# Patient Record
Sex: Male | Born: 1950 | Hispanic: Yes | Marital: Married | Smoking: Never smoker
Health system: Southern US, Community
[De-identification: ages and names within clinical notes are randomized; demographics above are authoritative.]

## PROBLEM LIST (undated history)

## (undated) DIAGNOSIS — I10 Essential (primary) hypertension: Secondary | ICD-10-CM

## (undated) DIAGNOSIS — E119 Type 2 diabetes mellitus without complications: Secondary | ICD-10-CM

## (undated) HISTORY — DX: Type 2 diabetes mellitus without complications: E11.9

## (undated) HISTORY — DX: Essential (primary) hypertension: I10

---

## 2016-07-23 ENCOUNTER — Ambulatory Visit: Payer: Self-pay

## 2016-07-25 ENCOUNTER — Encounter: Payer: Self-pay | Admitting: Emergency Medicine

## 2016-07-25 ENCOUNTER — Ambulatory Visit (INDEPENDENT_AMBULATORY_CARE_PROVIDER_SITE_OTHER): Payer: Self-pay | Admitting: Emergency Medicine

## 2016-07-25 VITALS — BP 136/78 | HR 78 | Temp 98.1°F | Resp 17 | Ht 70.5 in | Wt 210.0 lb

## 2016-07-25 DIAGNOSIS — E119 Type 2 diabetes mellitus without complications: Secondary | ICD-10-CM

## 2016-07-25 DIAGNOSIS — E079 Disorder of thyroid, unspecified: Secondary | ICD-10-CM

## 2016-07-25 DIAGNOSIS — I10 Essential (primary) hypertension: Secondary | ICD-10-CM

## 2016-07-25 MED ORDER — METFORMIN HCL ER 750 MG PO TB24: 750.0000 mg | ORAL_TABLET | Freq: Every day | ORAL | 5 refills | Status: AC

## 2016-07-25 MED ORDER — LEVOTHYROXINE SODIUM 50 MCG PO TABS: 50.0000 ug | ORAL_TABLET | Freq: Every day | ORAL | 3 refills | Status: AC

## 2016-07-25 MED ORDER — CARVEDILOL 6.25 MG PO TABS: 6.2500 mg | ORAL_TABLET | Freq: Two times a day (BID) | ORAL | 3 refills | Status: DC

## 2016-07-25 MED ORDER — IRBESARTAN 150 MG PO TABS: 150.0000 mg | ORAL_TABLET | Freq: Every day | ORAL | 5 refills | Status: AC

## 2016-07-25 MED ORDER — NIFEDIPINE ER 30 MG PO TB24: 30.0000 mg | ORAL_TABLET | Freq: Every day | ORAL | 5 refills | Status: AC

## 2016-07-25 NOTE — Patient Instructions (Addendum)
   IF you received an x-ray today, you will receive an invoice from [facility]. Please contact [facility] at [phone] with questions or concerns regarding your invoice.   IF you received labwork today, you will receive an invoice from [facility]. Please contact [facility] at [phone] with questions or concerns regarding your invoice.   Our billing staff will not be able to assist you with questions regarding bills from these companies.  You will be contacted with the lab results as soon as they are available. The fastest way to get your results is to activate your My Chart account. Instructions are located on the last page of this paperwork. If you have not heard from us regarding the results in 2 weeks, please contact this office.      Hypertension Hypertension is another name for high blood pressure. High blood pressure forces your heart to work harder to pump blood. This can cause problems over time. There are two numbers in a blood pressure reading. There is a top number (systolic) over a bottom number (diastolic). It is best to have a blood pressure below 120/80. Healthy choices can help lower your blood pressure. You may need medicine to help lower your blood pressure if:  Your blood pressure cannot be lowered with healthy choices.  Your blood pressure is higher than 130/80. Follow these instructions at home: Eating and drinking   If directed, follow the DASH eating plan. This diet includes:  Filling half of your plate at each meal with fruits and vegetables.  Filling one quarter of your plate at each meal with whole grains. Whole grains include whole wheat pasta, brown rice, and whole grain bread.  Eating or drinking low-fat dairy products, such as skim milk or low-fat yogurt.  Filling one quarter of your plate at each meal with low-fat (lean) proteins. Low-fat proteins include fish, skinless chicken, eggs, beans, and tofu.  Avoiding fatty meat,  cured and processed meat, or chicken with skin.  Avoiding premade or processed food.  Eat less than 1,500 mg of salt (sodium) a day.  Limit alcohol use to no more than 1 drink a day for nonpregnant women and 2 drinks a day for men. One drink equals 12 oz of beer, 5 oz of wine, or 1 oz of hard liquor. Lifestyle   Work with your doctor to stay at a healthy weight or to lose weight. Ask your doctor what the best weight is for you.  Get at least 30 minutes of exercise that causes your heart to beat faster (aerobic exercise) most days of the week. This may include walking, swimming, or biking.  Get at least 30 minutes of exercise that strengthens your muscles (resistance exercise) at least 3 days a week. This may include lifting weights or pilates.  Do not use any products that contain nicotine or tobacco. This includes cigarettes and e-cigarettes. If you need help quitting, ask your doctor.  Check your blood pressure at home as told by your doctor.  Keep all follow-up visits as told by your doctor. This is important. Medicines   Take over-the-counter and prescription medicines only as told by your doctor. Follow directions carefully.  Do not skip doses of blood pressure medicine. The medicine does not work as well if you skip doses. Skipping doses also puts you at risk for problems.  Ask your doctor about side effects or reactions to medicines that you should watch for. Contact a doctor if:  You think you are having a   reaction to the medicine you are taking.  You have headaches that keep coming back (recurring).  You feel dizzy.  You have swelling in your ankles.  You have trouble with your vision. Get help right away if:  You get a very bad headache.  You start to feel confused.  You feel weak or numb.  You feel faint.  You get very bad pain in your:  Chest.  Belly (abdomen).  You throw up (vomit) more than once.  You have trouble  breathing. Summary  Hypertension is another name for high blood pressure.  Making healthy choices can help lower blood pressure. If your blood pressure cannot be controlled with healthy choices, you may need to take medicine. This information is not intended to replace advice given to you by your health care provider. Make sure you discuss any questions you have with your health care provider. Document Released: 09/28/2007 Document Revised: 03/09/2016 Document Reviewed: 03/09/2016 Elsevier Interactive Patient Education  2017 Elsevier Inc.  

## 2016-07-25 NOTE — Progress Notes (Signed)
John Rogers 66 y.o.   Chief Complaint  Patient presents with  . Medication Refill    metformin, nifedipina,levothryin carvadol , irbesartan     HISTORY OF PRESENT ILLNESS: This is a 66 y.o. male with h/o HTN and DMT2 needs meds refill. Asymptomatic and has no complaints.  HPI   Prior to Admission medications   Medication Sig Start Date End Date Taking? Authorizing Provider  carvedilol (COREG) 6.25 MG tablet Take 1 tablet (6.25 mg total) by mouth 2 (two) times daily with a meal. 07/25/16   Georgina Quint, MD  irbesartan (AVAPRO) 150 MG tablet Take 1 tablet (150 mg total) by mouth daily. 07/25/16 08/24/16  Georgina Quint, MD  levothyroxine (SYNTHROID, LEVOTHROID) 50 MCG tablet Take 1 tablet (50 mcg total) by mouth daily. 07/25/16   Georgina Quint, MD  metFORMIN (GLUCOPHAGE XR) 750 MG 24 hr tablet Take 1 tablet (750 mg total) by mouth daily with breakfast. 07/25/16 08/24/16  Georgina Quint, MD  NIFEdipine (PROCARDIA-XL/ADALAT CC) 30 MG 24 hr tablet Take 1 tablet (30 mg total) by mouth daily. 07/25/16 08/24/16  Georgina Quint, MD    Allergies not on file  There are no active problems to display for this patient.   No past medical history on file.  No past surgical history on file.  Social History   Social History  . Marital status: Married    Spouse name: N/A  . Number of children: N/A  . Years of education: N/A   Occupational History  . Not on file.   Social History Main Topics  . Smoking status: Not on file  . Smokeless tobacco: Not on file  . Alcohol use Not on file  . Drug use: Unknown  . Sexual activity: Not on file   Other Topics Concern  . Not on file   Social History Narrative  . No narrative on file    No family history on file.   Review of Systems  Constitutional: Negative for chills, fever, malaise/fatigue and weight loss.  HENT: Negative for nosebleeds, sinus pain and sore throat.   Eyes: Negative for blurred vision, double  vision, discharge and redness.  Respiratory: Negative for cough, shortness of breath and wheezing.   Cardiovascular: Negative for chest pain, palpitations, claudication and leg swelling.  Gastrointestinal: Negative for abdominal pain, diarrhea, nausea and vomiting.  Genitourinary: Negative for dysuria and hematuria.  Musculoskeletal: Negative for myalgias and neck pain.  Skin: Negative for rash.  Neurological: Negative for dizziness and headaches.  All other systems reviewed and are negative.  Vitals:   07/25/16 1410  BP: 136/78  Pulse: 78  Resp: 17  Temp: 98.1 F (36.7 C)     Physical Exam  Constitutional: He is oriented to person, place, and time. He appears well-developed and well-nourished.  HENT:  Head: Normocephalic and atraumatic.  Nose: Nose normal.  Mouth/Throat: Oropharynx is clear and moist. No oropharyngeal exudate.  Eyes: Conjunctivae and EOM are normal. Pupils are equal, round, and reactive to light.  Neck: Normal range of motion. Neck supple. No JVD present.  Cardiovascular: Normal rate, regular rhythm, normal heart sounds and intact distal pulses.   Pulmonary/Chest: Effort normal and breath sounds normal.  Abdominal: Soft. Bowel sounds are normal. He exhibits no distension. There is no tenderness.  Musculoskeletal: Normal range of motion.  Lymphadenopathy:    He has no cervical adenopathy.  Neurological: He is alert and oriented to person, place, and time. He displays normal reflexes. No  sensory deficit. He exhibits normal muscle tone.  Skin: Skin is warm and dry. Capillary refill takes less than 2 seconds.  Psychiatric: He has a normal mood and affect. His behavior is normal.  Vitals reviewed.    ASSESSMENT & PLAN: Zayven was seen today for medication refill.  Diagnoses and all orders for this visit:  Essential hypertension  Type 2 diabetes mellitus without complication, without long-term current use of insulin (HCC)  Thyroid condition  Other  orders -     NIFEdipine (PROCARDIA-XL/ADALAT CC) 30 MG 24 hr tablet; Take 1 tablet (30 mg total) by mouth daily. -     irbesartan (AVAPRO) 150 MG tablet; Take 1 tablet (150 mg total) by mouth daily. -     metFORMIN (GLUCOPHAGE XR) 750 MG 24 hr tablet; Take 1 tablet (750 mg total) by mouth daily with breakfast. -     carvedilol (COREG) 6.25 MG tablet; Take 1 tablet (6.25 mg total) by mouth 2 (two) times daily with a meal. -     levothyroxine (SYNTHROID, LEVOTHROID) 50 MCG tablet; Take 1 tablet (50 mcg total) by mouth daily.    Patient Instructions       IF you received an x-ray today, you will receive an invoice from [facility]. Please contact [facility] at [phone] with questions or concerns regarding your invoice.   IF you received labwork today, you will receive an invoice from [facility]. Please contact [facility] at [phone] with questions or concerns regarding your invoice.   Our billing staff will not be able to assist you with questions regarding bills from these companies.  You will be contacted with the lab results as soon as they are available. The fastest way to get your results is to activate your My Chart account. Instructions are located on the last page of this paperwork. If you have not heard from [place] regarding the results in 2 weeks, please contact this office.      Hypertension Hypertension is another name for high blood pressure. High blood pressure forces your heart to work harder to pump blood. This can cause problems over time. There are two numbers in a blood pressure reading. There is a top number (systolic) over a bottom number (diastolic). It is best to have a blood pressure below 120/80. Healthy choices can help lower your blood pressure. You may need medicine to help lower your blood pressure if:  Your blood pressure cannot be lowered with healthy choices.  Your blood pressure is higher than 130/80. Follow these instructions at  home: Eating and drinking   If directed, follow the DASH eating plan. This diet includes:  Filling half of your plate at each meal with fruits and vegetables.  Filling one quarter of your plate at each meal with whole grains. Whole grains include whole wheat pasta, brown rice, and whole grain bread.  Eating or drinking low-fat dairy products, such as skim milk or low-fat yogurt.  Filling one quarter of your plate at each meal with low-fat (lean) proteins. Low-fat proteins include fish, skinless chicken, eggs, beans, and tofu.  Avoiding fatty meat, cured and processed meat, or chicken with skin.  Avoiding premade or processed food.  Eat less than 1,500 mg of salt (sodium) a day.  Limit alcohol use to no more than 1 drink a day for nonpregnant women and 2 drinks a day for men. One drink equals 12 oz of beer, 5 oz of wine, or 1 oz of hard liquor. Lifestyle   Work with  your doctor to stay at a healthy weight or to lose weight. Ask your doctor what the best weight is for you.  Get at least 30 minutes of exercise that causes your heart to beat faster (aerobic exercise) most days of the week. This may include walking, swimming, or biking.  Get at least 30 minutes of exercise that strengthens your muscles (resistance exercise) at least 3 days a week. This may include lifting weights or pilates.  Do not use any products that contain nicotine or tobacco. This includes cigarettes and e-cigarettes. If you need help quitting, ask your doctor.  Check your blood pressure at home as told by your doctor.  Keep all follow-up visits as told by your doctor. This is important. Medicines   Take over-the-counter and prescription medicines only as told by your doctor. Follow directions carefully.  Do not skip doses of blood pressure medicine. The medicine does not work as well if you skip doses. Skipping doses also puts you at risk for problems.  Ask your doctor about side effects or reactions to  medicines that you should watch for. Contact a doctor if:  You think you are having a reaction to the medicine you are taking.  You have headaches that keep coming back (recurring).  You feel dizzy.  You have swelling in your ankles.  You have trouble with your vision. Get help right away if:  You get a very bad headache.  You start to feel confused.  You feel weak or numb.  You feel faint.  You get very bad pain in your:  Chest.  Belly (abdomen).  You throw up (vomit) more than once.  You have trouble breathing. Summary  Hypertension is another name for high blood pressure.  Making healthy choices can help lower blood pressure. If your blood pressure cannot be controlled with healthy choices, you may need to take medicine. This information is not intended to replace advice given to you by your health care provider. Make sure you discuss any questions you have with your health care provider. Document Released: 09/28/2007 Document Revised: 03/09/2016 Document Reviewed: 03/09/2016 Elsevier Interactive Patient Education  2017 Elsevier Inc.      Edwina Barth, MD [facility]

## 2016-09-09 ENCOUNTER — Telehealth: Payer: Self-pay | Admitting: Emergency Medicine

## 2016-09-09 NOTE — Telephone Encounter (Signed)
Patients daughter called in stating that Dr  Alvy BimlerSagardia gave her dad a RX for NIFEdipine (PROCARDIA-XL/ADALAT CC) 30 MG 24 hr tablet but it needs to be 60MG  per the patients Cardiologist.  [place] has since moved to [place] and the medication needs to be sent to [address] [place], [place] [id number] phone number for the store is [phone]   The call back number for the patients daughter is  [phone]

## 2016-09-09 NOTE — Telephone Encounter (Signed)
Advised we can not change dose, they would have to call cardiologist who changed for refill. 30mg  was sent to local pharmacy in [place] in April with 5 refills Daughter will call cardiology

## 2016-09-09 NOTE — Telephone Encounter (Signed)
CORRECTION ON PHARMACY NUMBER  [phone]

## 2016-10-08 ENCOUNTER — Other Ambulatory Visit: Payer: Self-pay | Admitting: Emergency Medicine

## 2017-08-10 ENCOUNTER — Telehealth: Payer: Self-pay | Admitting: *Deleted

## 2017-08-10 NOTE — Telephone Encounter (Signed)
Refill (3) request faxed to [facility] at 6:20 pm patient needs an appointment. Confirmation page received at 6:21.

## 2017-08-30 ENCOUNTER — Other Ambulatory Visit: Payer: Self-pay | Admitting: Emergency Medicine

## 2021-08-30 IMAGING — CT TC CRANIO OU SELA TURCICA OU ORBITAS
1 series · 15 of 30 positions shown, 19 images · non-contrast
Comparison: none

[Series 3: partes moles head 2.0 · axial · 0.46mm/px · z∈[-516,-342]mm · 15 of 188 slices shown, 19 images]
[im 7/188  brain]
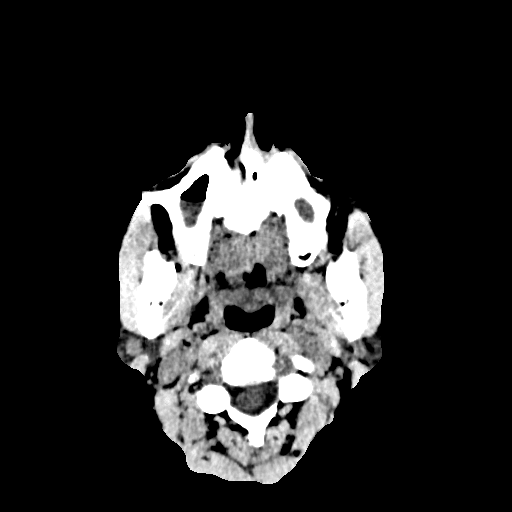
[im 7/188  bone]
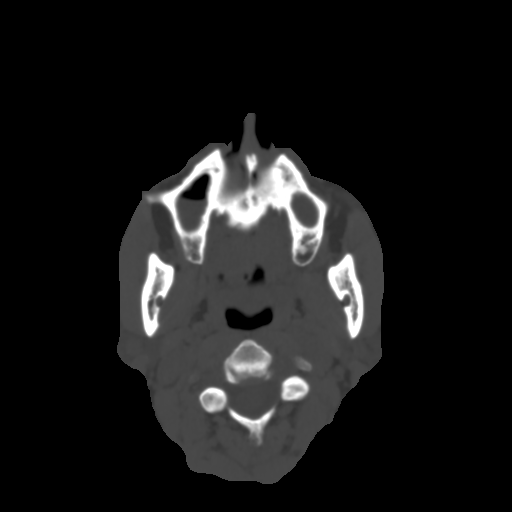
[im 20/188  bone]
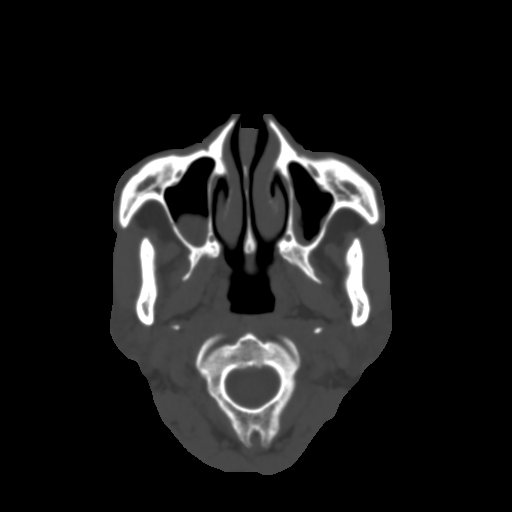
[im 33/188  bone]
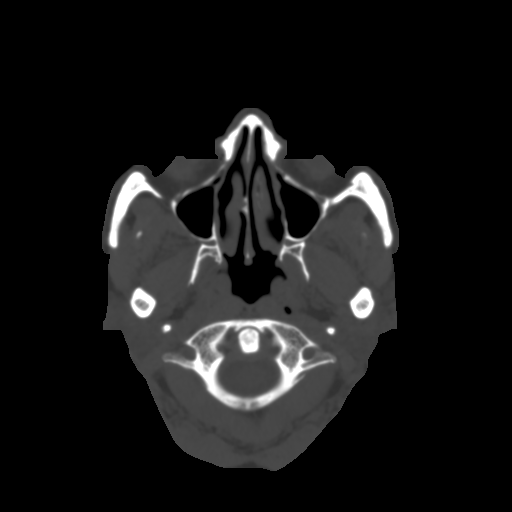
[im 46/188  bone]
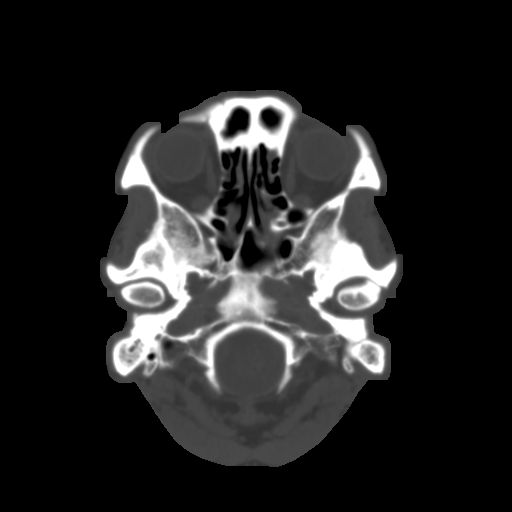
[im 59/188  brain]
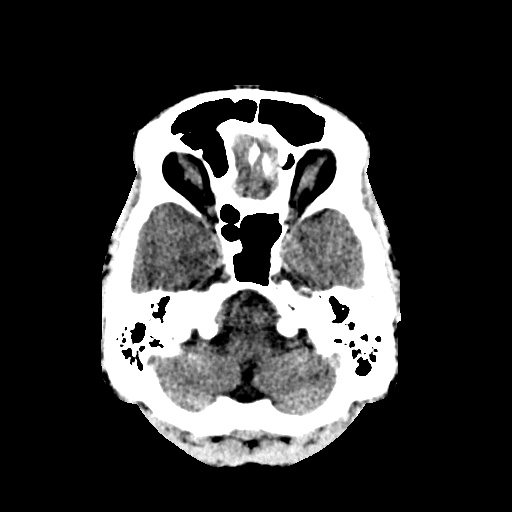
[im 59/188  bone]
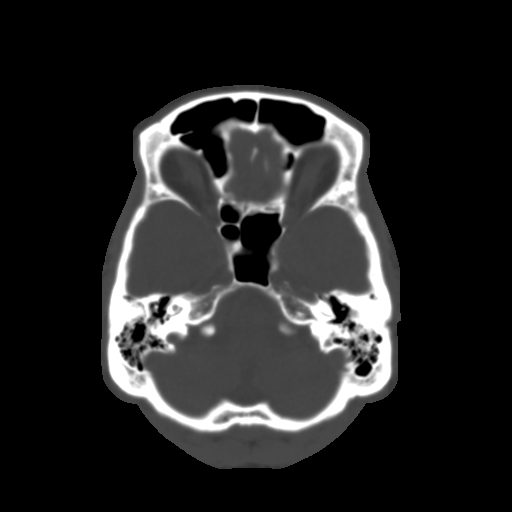
[im 71/188  bone]
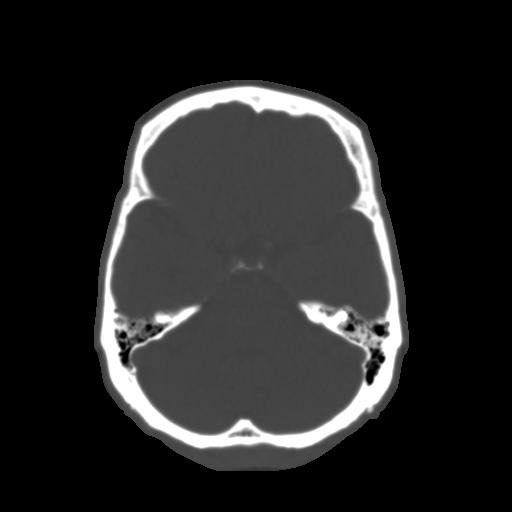
[im 84/188  bone]
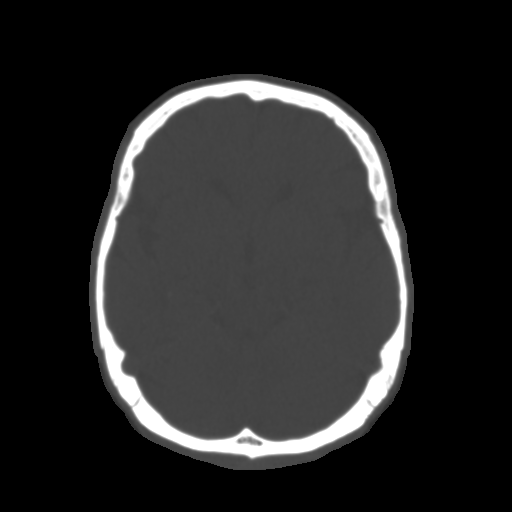
[im 97/188  bone]
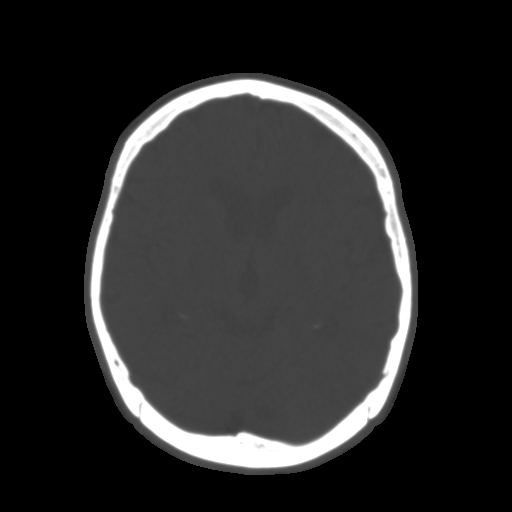
[im 104/188  brain]
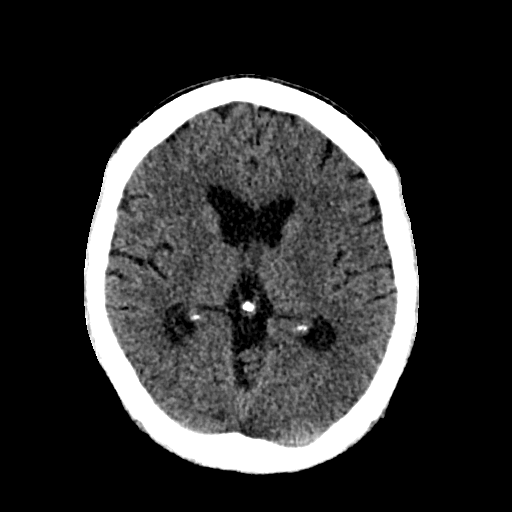
[im 104/188  bone]
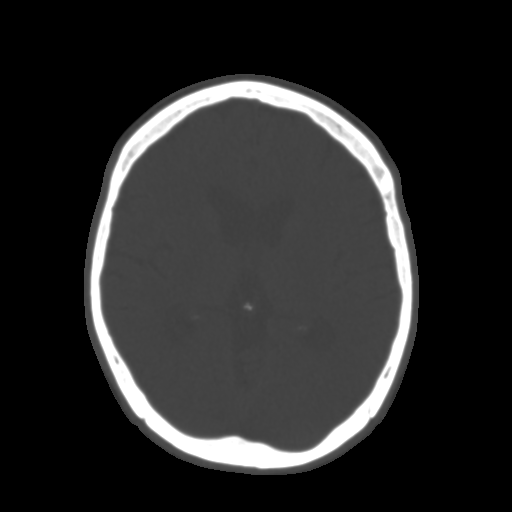
[im 117/188  bone]
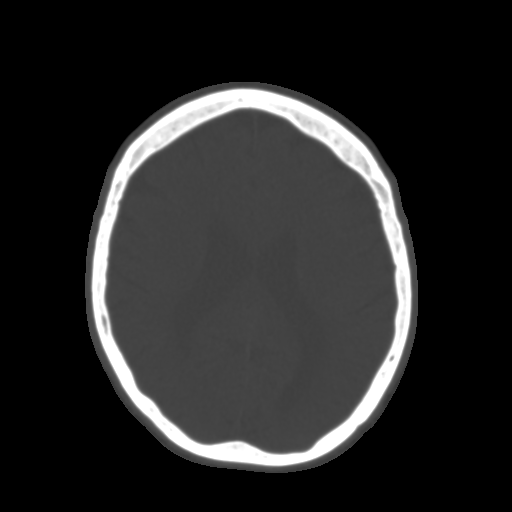
[im 129/188  bone]
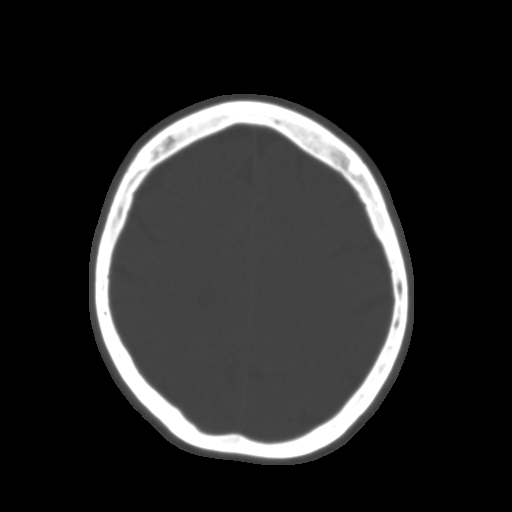
[im 142/188  bone]
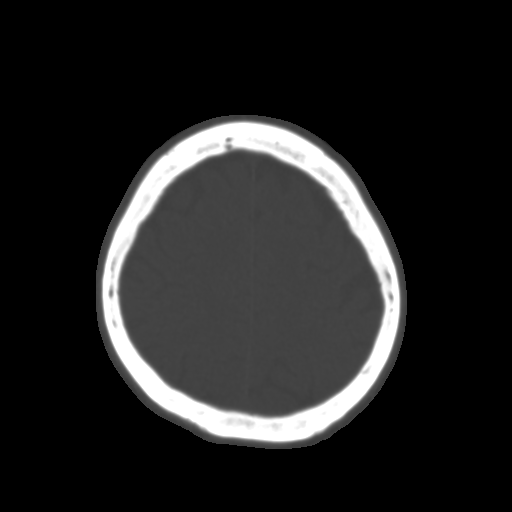
[im 155/188  brain]
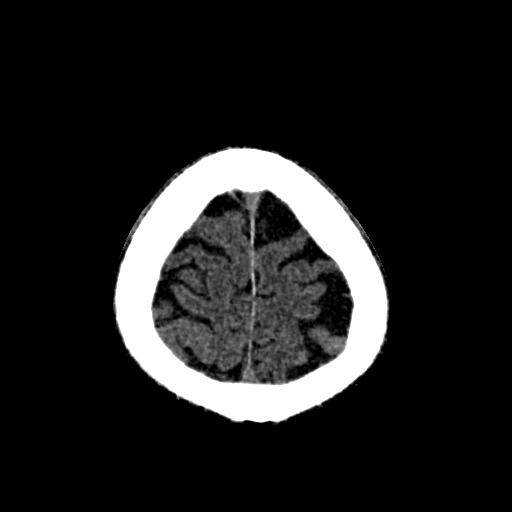
[im 155/188  bone]
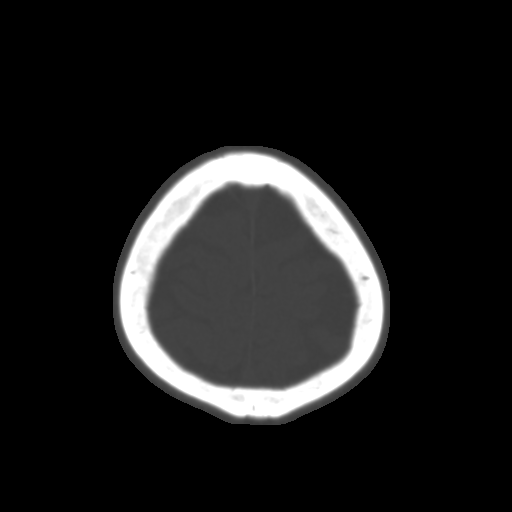
[im 168/188  bone]
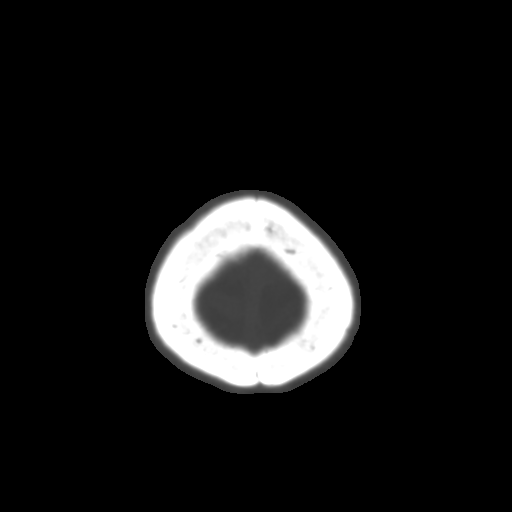
[im 181/188  bone]
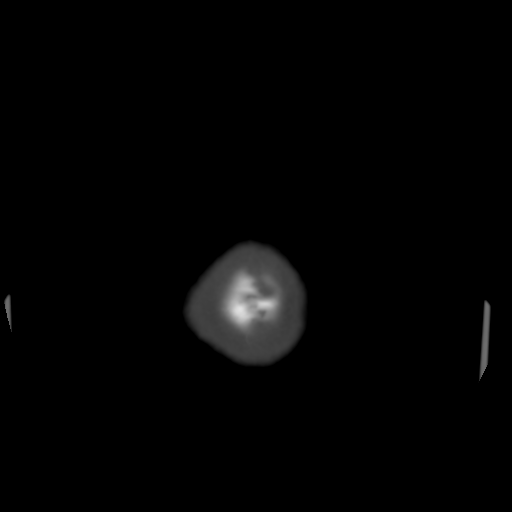

[15 of 30 positions shown; findings below may reference images not displayed]

TÉCNICA DO EXAME:
Realizada aquisição sem injeção endovenosa de meio de contraste iodado. 
ASPECTOS OBSERVADOS:
Discretas áreas hipodensas mal definidas na substância branca supratentorial, com predomínio
periventricular, de caráter não expansivo.
TOMOGRAFIA COMPUTADORIZADA DO CRÂNIO
Restante do parênquima cerebral com coeficientes de atenuação normais e morfologia preservada. 
Tronco cerebral, hemisférios cerebelares e vérmis anatômicos e com valores de atenuação normais.
Alargamento difuso dos sulcos corticais e cisternas subaracnóideas, associado a ventriculomegalia difusa de
aspecto simétrico e não hipertensivo.
Não há evidências de lesões e/ou coleções expansivas intra ou extra-axiais, sejam elas acima ou abaixo do
tentório.
Discretas calcificações parietais nas artérias carótidas internas.
Estruturas ósseas íntegras.
Seios paranasais visíveis de aspecto usual.
IMPRESSÃO:
Discretas áreas hipodensas mal definidas na substância branca supratentorial, inespecífico, provavelmente
relacionadas a microangiopatia aterosclerótica.
Redução volumétrica encefálica difusa.
Ateromatose carotídea.
[facility]

## 2023-03-01 IMAGING — MR Colunas Lombar
4 of 5 series · 30 of 48 positions shown · non-contrast
Comparison: none

[Series 2001: cor t2_stir · coronal · 4.5mm · 0.91mm/px · 8 of 15 slices shown]
[im 1/15]
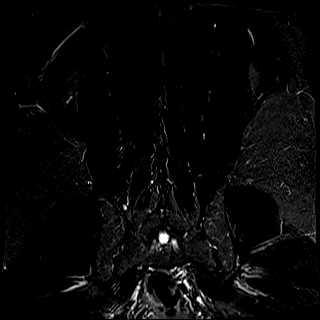
[im 2/15]
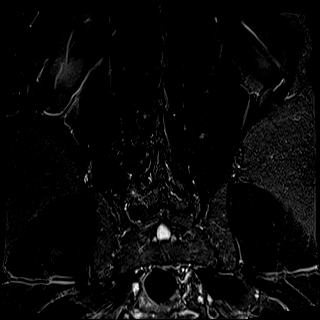
[im 5/15]
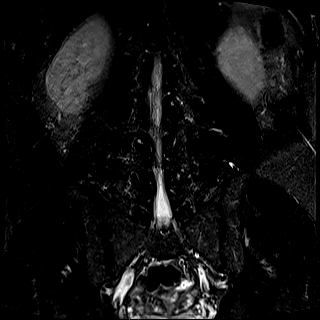
[im 7/15]
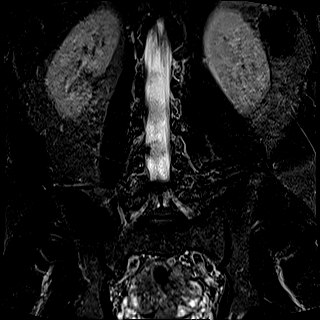
[im 8/15]
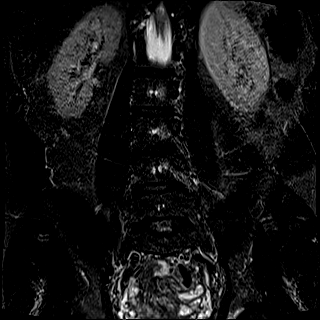
[im 10/15]
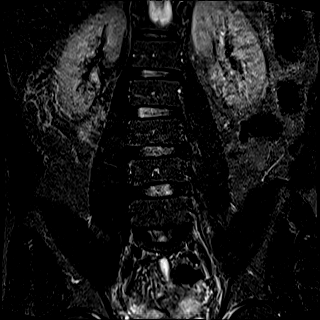
[im 13/15]
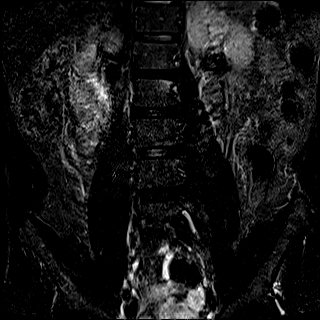
[im 15/15]
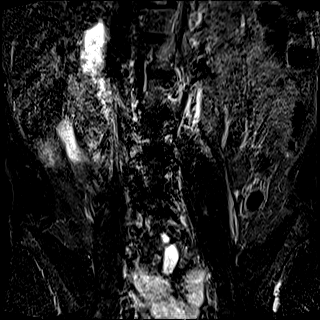

[Series 3001: sagital_t1 · sagittal · 4.5mm · 0.94mm/px · 8 of 12 slices shown]
[im 1/12]
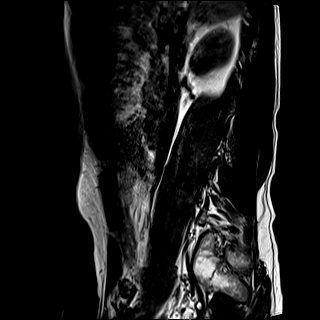
[im 2/12]
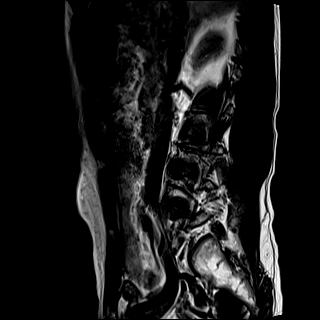
[im 4/12]
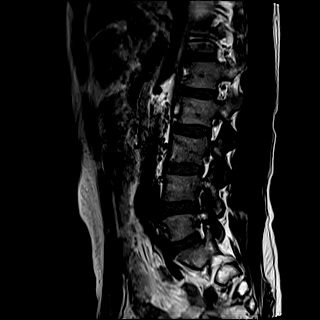
[im 5/12]
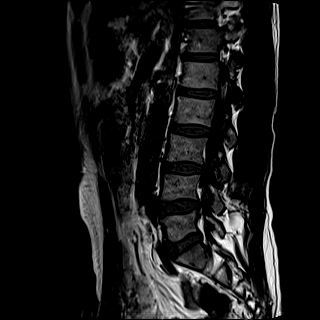
[im 7/12]
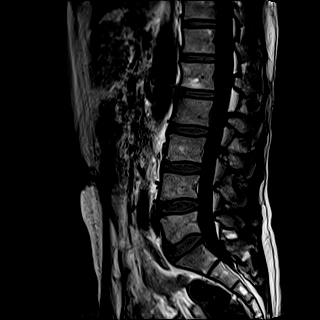
[im 8/12]
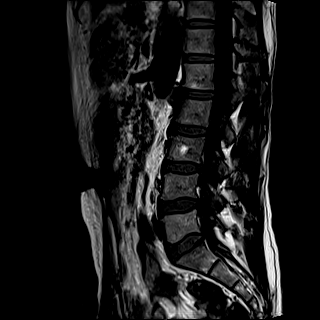
[im 10/12]
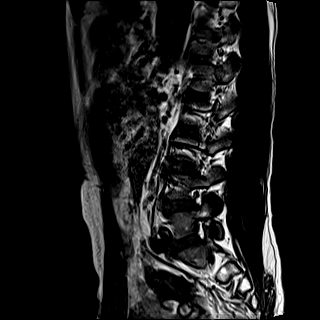
[im 12/12]
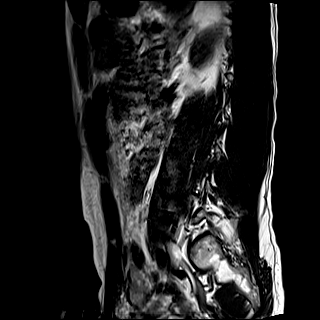

[Series 4001: sagital_t2_stir · sagittal · 4.5mm · 0.94mm/px · 8 of 12 slices shown]
[im 1/12]
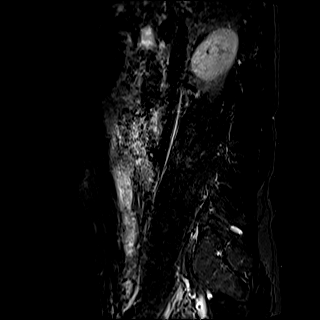
[im 2/12]
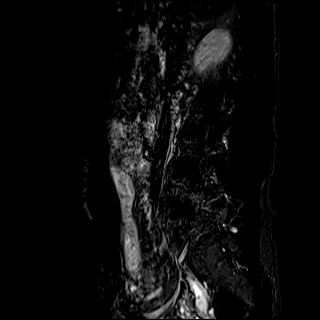
[im 4/12]
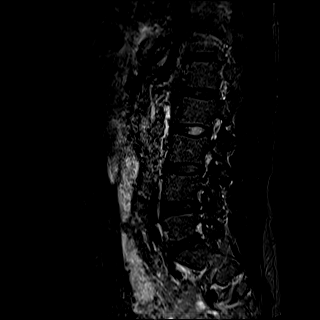
[im 5/12]
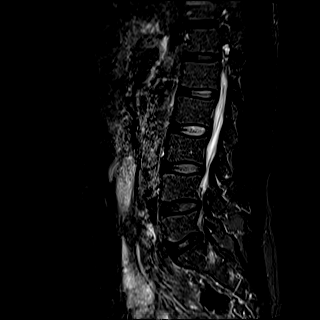
[im 7/12]
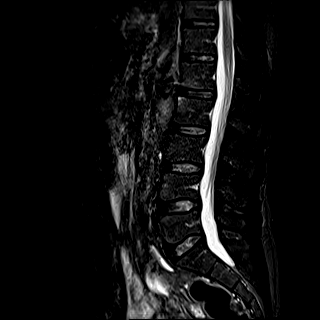
[im 8/12]
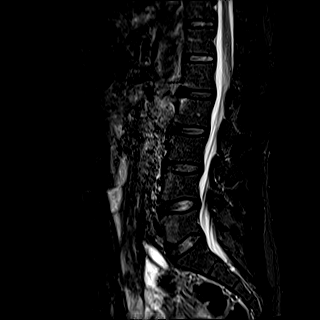
[im 10/12]
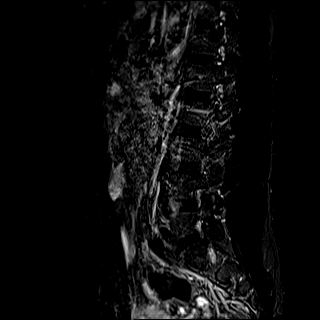
[im 12/12]
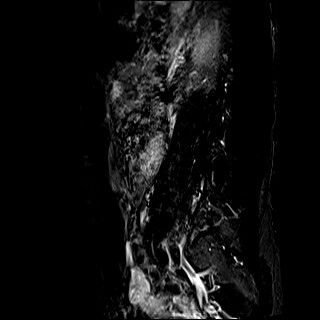

[Series 5001: sagital_t2 · sagittal · 4.5mm · 0.47mm/px · 6 of 12 slices shown]
[im 1/12]
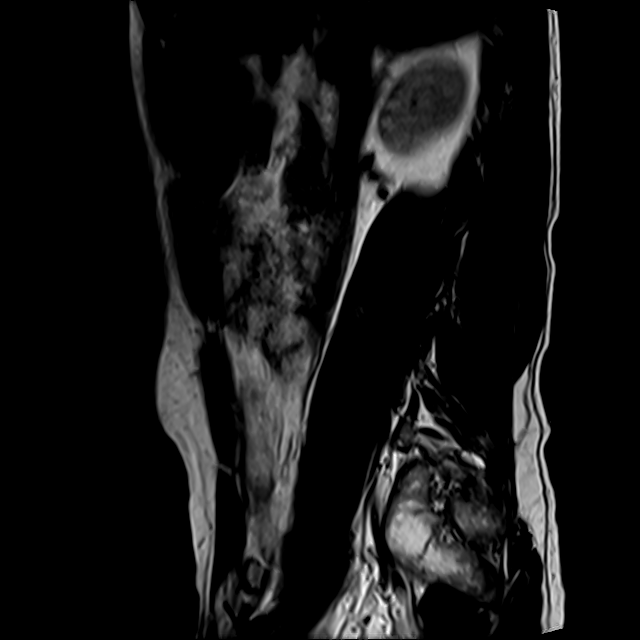
[im 2/12]
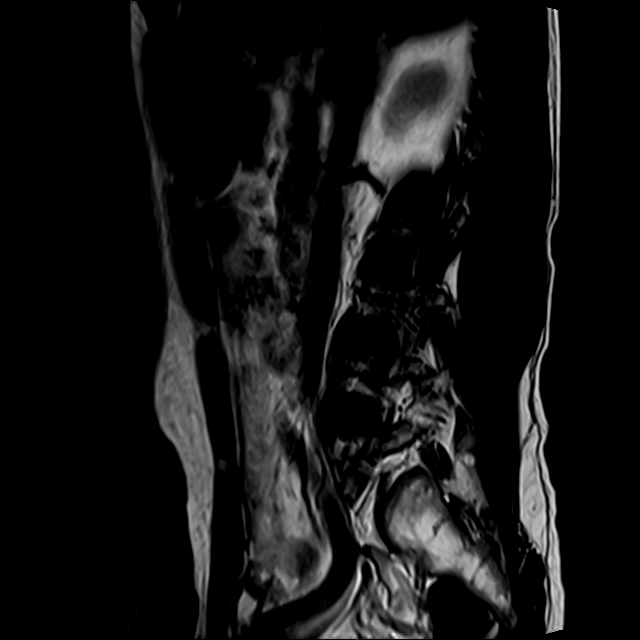
[im 4/12]
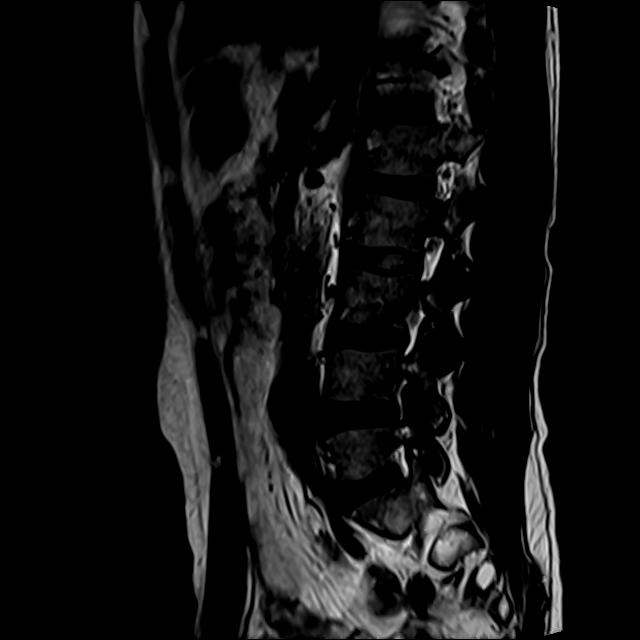
[im 5/12]
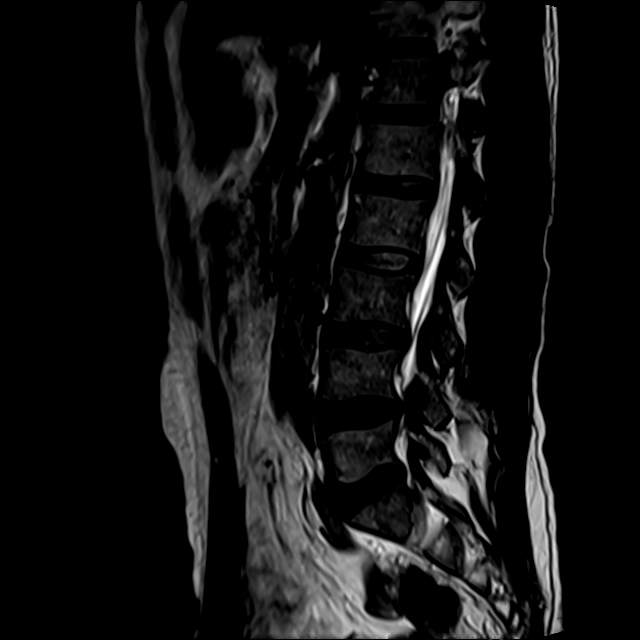
[im 7/12]
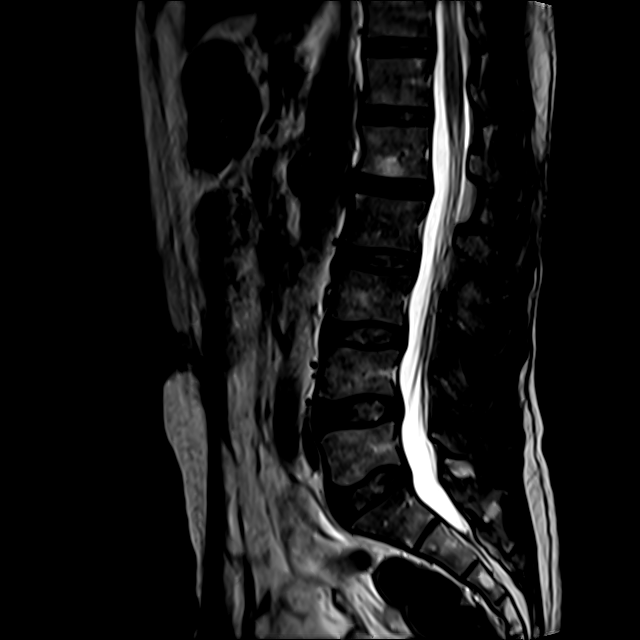
[im 10/12]
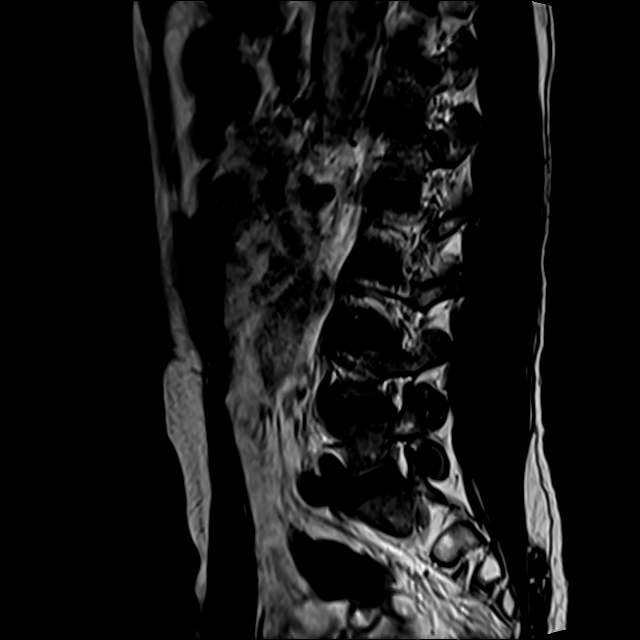

[30 of 48 positions shown; findings below may reference images not displayed]

RESSONÂNCIA
DE COLUNA LOMBAR
Método:
INFORMAÇÃO ADICIONAL: -
Realizado estudo de ressonância magnética de coluna lombar com técnica FSE, com sequências ponderadas
em T1 e em T2, sem a infusão endovenosa do meio de contraste paramagnético.
Análise:
Corpos vertebrais alinhados e com altura preservada.
Osteófitos marginais anteriores lombares. 
Alterações degenerativas incipientes das articulações interapofisárias. 
Espessamento dos ligamentos amarelos de L2-L3 a L4-L5. 
Abaulamentos discais em L1-L2, L3-L4, L4-L5 e L5-S1, tocando a face ventral do saco dural e estreitando os
MAGNÉTICA
forames intervertebrais, sem sinais de compressão radicular.
O canal vertebral permanece amplo.
Cone medular com morfologia e intensidade de sinal preservados.
Raízes da cauda equina com distribuição habitual.
Demais forames intervertebrais com amplitude preservada.
Planos musculoadiposos paravertebrais de aspecto habitual.
Impressão Diagnóstica:
Abaulamentos discais em L1-L2, L3-L4, L4-L5 e L5-S1, sem sinais de compressão radicular.
[facility]

## 2023-08-25 IMAGING — MR Angios Abdomen
16 of 17 series · 39 of 40 positions shown · IV contrast (GADOLINIO)
Comparison: none

[Series 2001: axial_t2_trufi · axial · 4.5mm · 0.84mm/px · 1 of 47 slices shown]
[im 1/47]
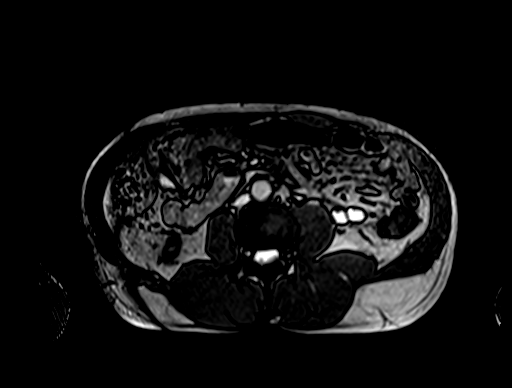

[Series 4001: coronal_t2_trufi · coronal · 4.0mm · 0.74mm/px · 1 of 35 slices shown]
[im 1/35]
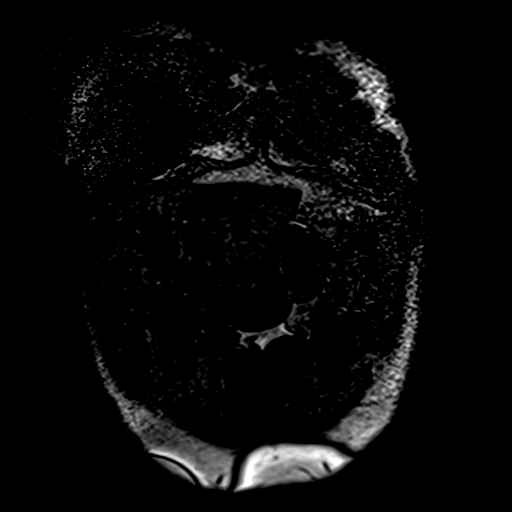

[Series 5001: coronal_t2_apneia · coronal · 6.0mm · 1.41mm/px · 1 of 30 slices shown]
[im 1/30]
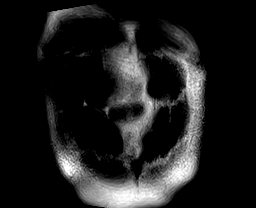

[Series 6001: sagitla_t2_trufi · sagittal · 4.0mm · 0.78mm/px · 1 of 52 slices shown]
[im 1/52]
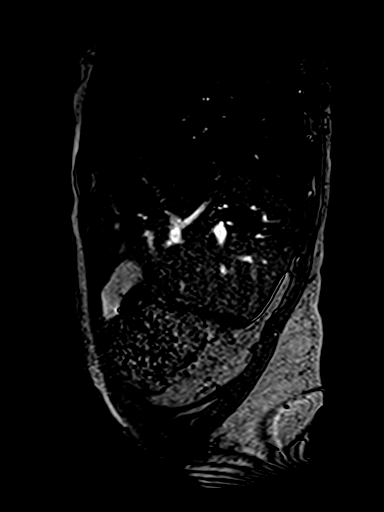

[Series 7001: axial_t2_haste_apneia · axial · 6.0mm · 1.19mm/px · 1 of 33 slices shown]
[im 1/33]
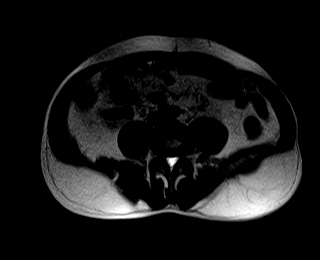

[angio_(person_name)3(person_name)_cor_pre · coronal · 1.3mm · 0.99mm/px · 2 of 96 slices shown (1 of 2)]
[im 1/96]
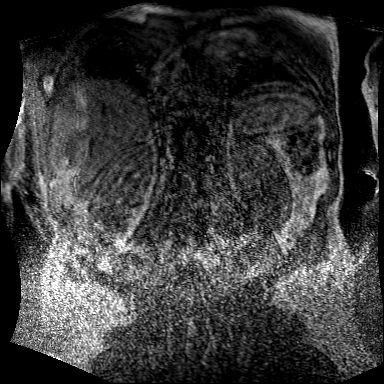
[im 96/96]
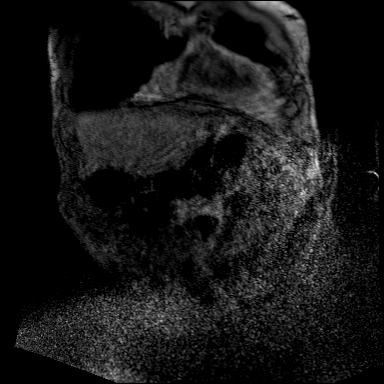

[angio_(person_name)3(person_name)_cor_pre · coronal · 1.3mm · 0.99mm/px · 3 of 96 slices shown (2 of 2)]
[im 1/96]
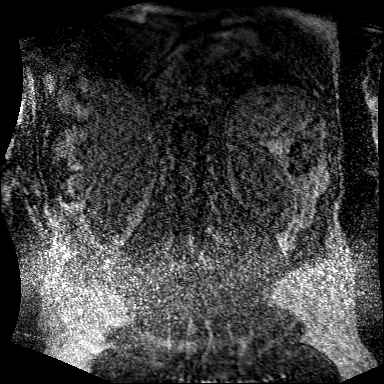
[im 48/96]
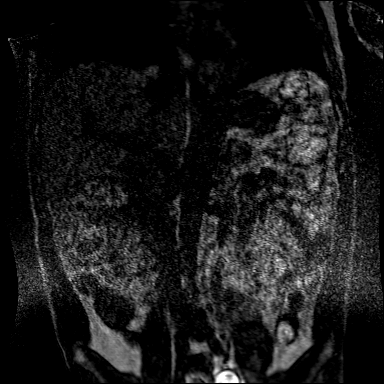
[im 96/96]
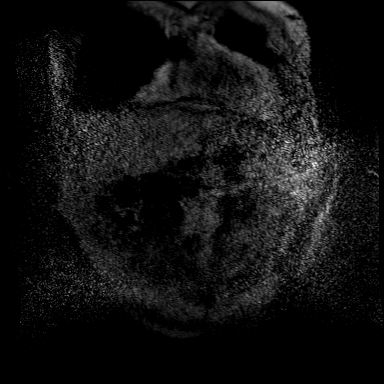

[angio_(person_name)3(person_name)_cor_pre_sub · coronal · 1.3mm · 0.99mm/px · 3 of 96 slices shown]
[im 1/96]
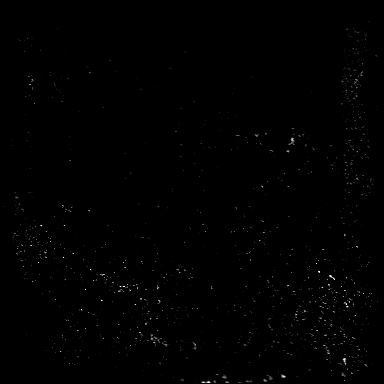
[im 48/96]
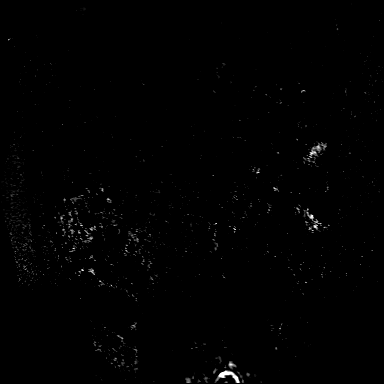
[im 96/96]
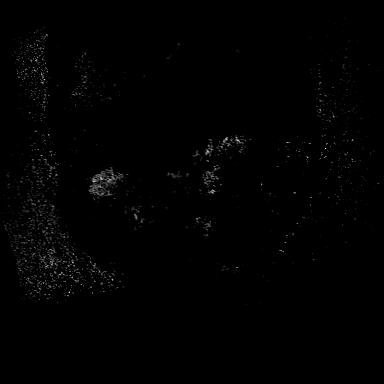

[axial_t1_vibe_dixon_pre_f · axial · 3.0mm · 1.25mm/px · z∈[-227,+34]mm · 3 of 88 slices shown]
[im 1/88]
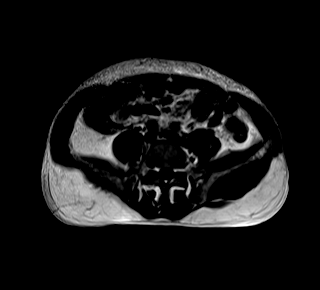
[im 44/88]
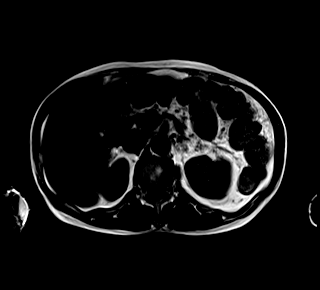
[im 88/88]
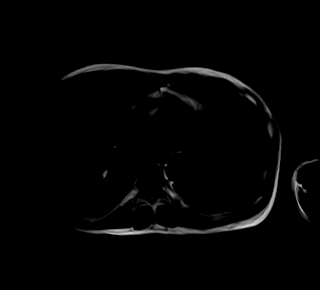

[axial_t1_vibe_dixon_pre_w · axial · 3.0mm · 1.25mm/px · z∈[-227,+34]mm · 3 of 88 slices shown]
[im 1/88]
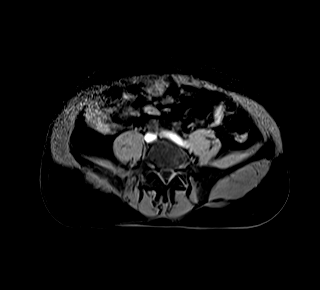
[im 44/88]
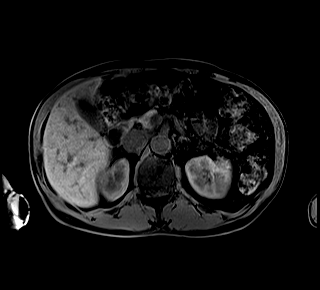
[im 88/88]
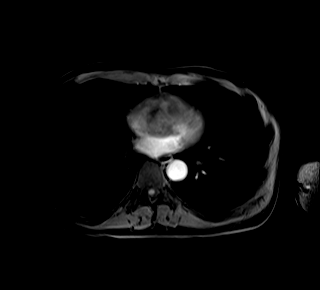

[angio_(person_name)3(person_name)_cor_pos · coronal · 1.3mm · 0.99mm/px · 3 of 96 slices shown]
[im 1/96]
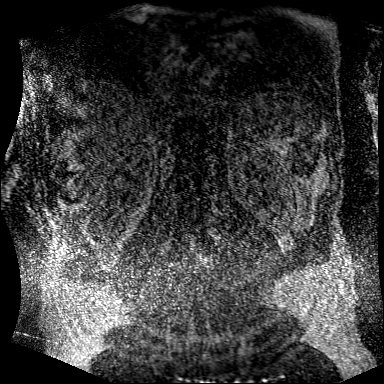
[im 48/96]
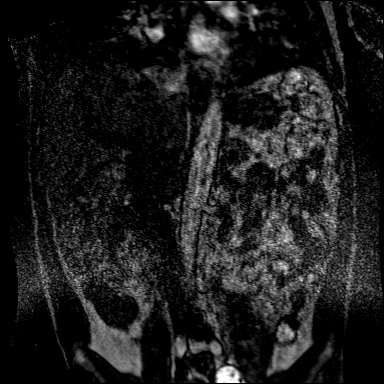
[im 96/96]
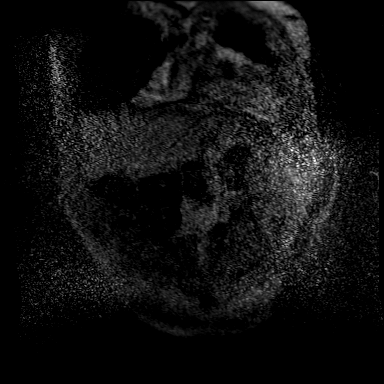

[angio_(person_name)3(person_name)_cor_pos_sub · coronal · 1.3mm · 0.99mm/px · 3 of 96 slices shown]
[im 1/96]
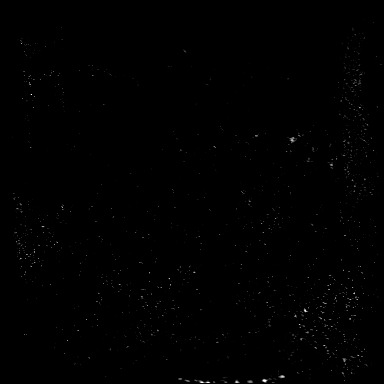
[im 48/96]
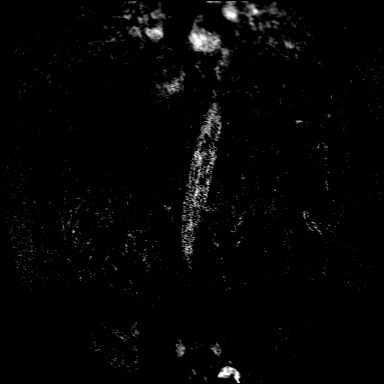
[im 96/96]
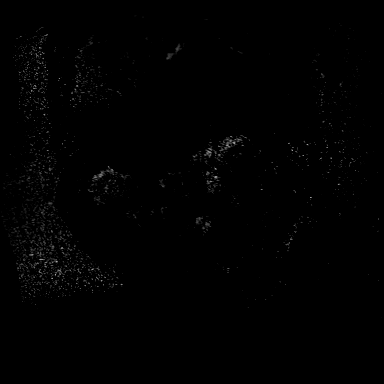

[axial_t1_vibe_dixon_pos_f · axial · 3.0mm · 1.25mm/px · z∈[-227,+34]mm · 3 of 88 slices shown]
[im 1/88]
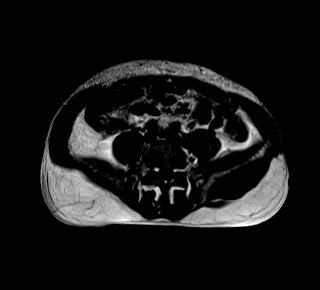
[im 44/88]
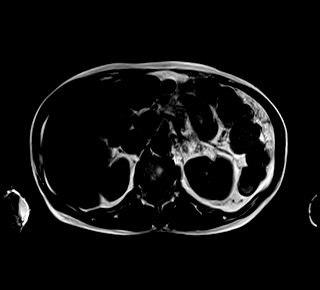
[im 88/88]
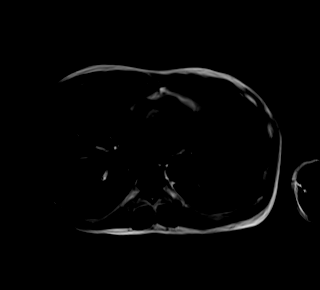

[axial_t1_vibe_dixon_pos_w · axial · 3.0mm · 1.25mm/px · z∈[-227,+34]mm · 3 of 88 slices shown]
[im 1/88]
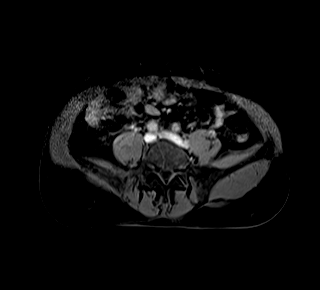
[im 44/88]
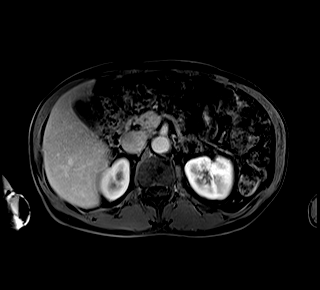
[im 88/88]
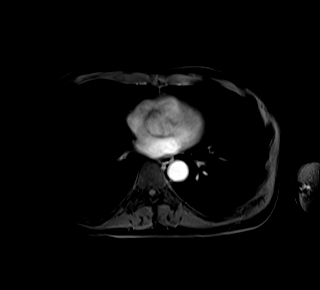

[coronal_t1_vibe_dixon_pos_f · coronal · 1.6mm · 1.56mm/px · 4 of 127 slices shown]
[im 1/127]
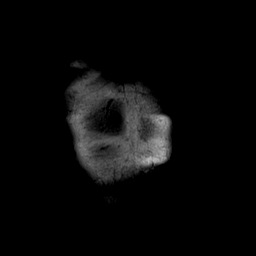
[im 43/127]
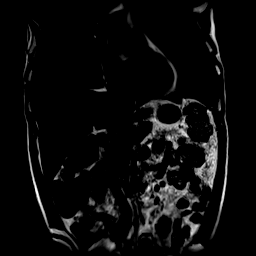
[im 85/127]
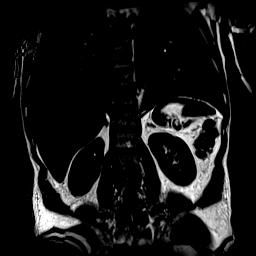
[im 127/127]
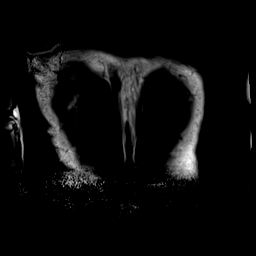

[coronal_t1_vibe_dixon_pos_w · coronal · 1.6mm · 1.56mm/px · 4 of 128 slices shown]
[im 1/128]
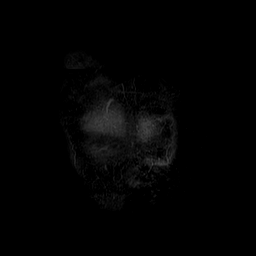
[im 43/128]
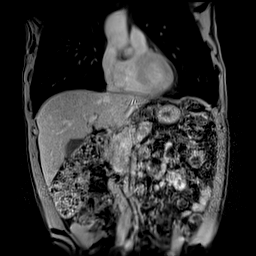
[im 85/128]
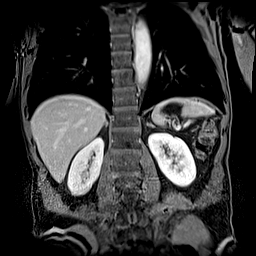
[im 128/128]
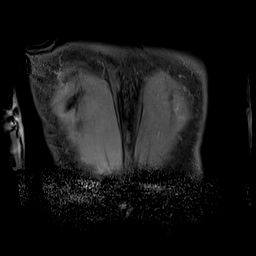

[39 of 40 positions shown; findings below may reference images not displayed]

ANGIORRESSONANCIA ARTERIAL E VENOSA DO ABDOME E PELVE

Indicação clínica: ateromatose
Técnica: sequências volumétricas antes e durante a administração endovenosa de contraste paramagnético.
Meios de contraste: EV.
INFORMAÇÃO ADICIONAL: -

Análise:
Aorta abdominal de calibre e trajetos normais, apresentando discreto espessamento e focos de ateromatose
parietal.
Tronco celíaco, artérias mesentéricas superior e inferior pérvias e de calibre normal.
Artérias renais únicas, pérvias e de calibre normal.
Artérias ilíacas comuns, externas e internas pérvias e de calibre normal.
Achados adicionais:
Restante do exame sem alterações significativas com o protocolo utilizado.

[facility]
[place] [facility]
